# Patient Record
Sex: Female | Born: 1960 | Race: White | Hispanic: No | Marital: Single | Smoking: Former smoker
Health system: Southern US, Community
[De-identification: ages and names within clinical notes are randomized; demographics above are authoritative.]

## PROBLEM LIST (undated history)

## (undated) DIAGNOSIS — G4700 Insomnia, unspecified: Secondary | ICD-10-CM

## (undated) DIAGNOSIS — J449 Chronic obstructive pulmonary disease, unspecified: Secondary | ICD-10-CM

## (undated) DIAGNOSIS — M810 Age-related osteoporosis without current pathological fracture: Secondary | ICD-10-CM

## (undated) HISTORY — DX: Chronic obstructive pulmonary disease, unspecified: J44.9

## (undated) HISTORY — DX: Age-related osteoporosis without current pathological fracture: M81.0

## (undated) HISTORY — DX: Insomnia, unspecified: G47.00

## (undated) HISTORY — PX: BREAST BIOPSY: SHX20

---

## 2009-09-08 ENCOUNTER — Emergency Department: Payer: Self-pay | Admitting: Emergency Medicine

## 2016-02-19 DIAGNOSIS — J209 Acute bronchitis, unspecified: Secondary | ICD-10-CM | POA: Diagnosis not present

## 2016-03-02 DIAGNOSIS — R52 Pain, unspecified: Secondary | ICD-10-CM | POA: Diagnosis not present

## 2016-03-02 DIAGNOSIS — B999 Unspecified infectious disease: Secondary | ICD-10-CM | POA: Diagnosis not present

## 2016-03-02 DIAGNOSIS — R110 Nausea: Secondary | ICD-10-CM | POA: Diagnosis not present

## 2016-03-02 DIAGNOSIS — M2550 Pain in unspecified joint: Secondary | ICD-10-CM | POA: Diagnosis not present

## 2016-03-22 DIAGNOSIS — Z6821 Body mass index (BMI) 21.0-21.9, adult: Secondary | ICD-10-CM | POA: Diagnosis not present

## 2016-03-22 DIAGNOSIS — N939 Abnormal uterine and vaginal bleeding, unspecified: Secondary | ICD-10-CM | POA: Diagnosis not present

## 2016-03-22 DIAGNOSIS — F17200 Nicotine dependence, unspecified, uncomplicated: Secondary | ICD-10-CM | POA: Diagnosis not present

## 2016-03-30 DIAGNOSIS — R05 Cough: Secondary | ICD-10-CM | POA: Diagnosis not present

## 2016-03-30 DIAGNOSIS — J069 Acute upper respiratory infection, unspecified: Secondary | ICD-10-CM | POA: Diagnosis not present

## 2016-04-01 DIAGNOSIS — E860 Dehydration: Secondary | ICD-10-CM | POA: Diagnosis not present

## 2016-04-01 DIAGNOSIS — R109 Unspecified abdominal pain: Secondary | ICD-10-CM | POA: Diagnosis not present

## 2016-04-01 DIAGNOSIS — R197 Diarrhea, unspecified: Secondary | ICD-10-CM | POA: Diagnosis not present

## 2016-04-01 DIAGNOSIS — R1012 Left upper quadrant pain: Secondary | ICD-10-CM | POA: Diagnosis not present

## 2016-04-01 DIAGNOSIS — R112 Nausea with vomiting, unspecified: Secondary | ICD-10-CM | POA: Diagnosis not present

## 2016-04-11 DIAGNOSIS — L639 Alopecia areata, unspecified: Secondary | ICD-10-CM | POA: Diagnosis not present

## 2016-05-04 DIAGNOSIS — M810 Age-related osteoporosis without current pathological fracture: Secondary | ICD-10-CM | POA: Diagnosis not present

## 2016-06-03 DIAGNOSIS — L639 Alopecia areata, unspecified: Secondary | ICD-10-CM | POA: Diagnosis not present

## 2016-07-15 DIAGNOSIS — L639 Alopecia areata, unspecified: Secondary | ICD-10-CM | POA: Diagnosis not present

## 2016-08-11 DIAGNOSIS — J449 Chronic obstructive pulmonary disease, unspecified: Secondary | ICD-10-CM | POA: Diagnosis not present

## 2016-08-16 ENCOUNTER — Ambulatory Visit: Payer: Self-pay | Admitting: Family Medicine

## 2016-08-25 DIAGNOSIS — L639 Alopecia areata, unspecified: Secondary | ICD-10-CM | POA: Diagnosis not present

## 2016-10-07 DIAGNOSIS — L639 Alopecia areata, unspecified: Secondary | ICD-10-CM | POA: Diagnosis not present

## 2016-12-09 DIAGNOSIS — L639 Alopecia areata, unspecified: Secondary | ICD-10-CM | POA: Diagnosis not present

## 2016-12-29 DIAGNOSIS — J0100 Acute maxillary sinusitis, unspecified: Secondary | ICD-10-CM | POA: Diagnosis not present

## 2016-12-29 DIAGNOSIS — J050 Acute obstructive laryngitis [croup]: Secondary | ICD-10-CM | POA: Diagnosis not present

## 2017-01-06 ENCOUNTER — Other Ambulatory Visit: Payer: Self-pay | Admitting: Internal Medicine

## 2017-01-06 DIAGNOSIS — J449 Chronic obstructive pulmonary disease, unspecified: Secondary | ICD-10-CM | POA: Diagnosis not present

## 2017-01-06 DIAGNOSIS — L638 Other alopecia areata: Secondary | ICD-10-CM | POA: Diagnosis not present

## 2017-01-06 DIAGNOSIS — N390 Urinary tract infection, site not specified: Secondary | ICD-10-CM | POA: Diagnosis not present

## 2017-01-06 DIAGNOSIS — Z1231 Encounter for screening mammogram for malignant neoplasm of breast: Secondary | ICD-10-CM

## 2017-01-09 ENCOUNTER — Other Ambulatory Visit: Payer: Self-pay | Admitting: Internal Medicine

## 2017-01-09 DIAGNOSIS — R102 Pelvic and perineal pain: Secondary | ICD-10-CM

## 2017-01-16 ENCOUNTER — Ambulatory Visit: Payer: BLUE CROSS/BLUE SHIELD

## 2017-01-19 ENCOUNTER — Other Ambulatory Visit: Payer: Self-pay | Admitting: *Deleted

## 2017-01-19 ENCOUNTER — Inpatient Hospital Stay
Admission: RE | Admit: 2017-01-19 | Discharge: 2017-01-19 | Disposition: A | Discharge status: Home / Self Care | Payer: Self-pay | Source: Ambulatory Visit | Attending: *Deleted | Admitting: *Deleted

## 2017-01-19 DIAGNOSIS — Z9289 Personal history of other medical treatment: Secondary | ICD-10-CM

## 2017-02-02 DIAGNOSIS — L639 Alopecia areata, unspecified: Secondary | ICD-10-CM | POA: Diagnosis not present

## 2017-02-03 ENCOUNTER — Encounter: Payer: Self-pay | Admitting: Radiology

## 2017-02-03 ENCOUNTER — Ambulatory Visit
Admission: RE | Admit: 2017-02-03 | Discharge: 2017-02-03 | Disposition: A | Discharge status: Home / Self Care | Payer: BLUE CROSS/BLUE SHIELD | Source: Ambulatory Visit | Attending: Internal Medicine | Admitting: Internal Medicine

## 2017-02-03 DIAGNOSIS — Z1231 Encounter for screening mammogram for malignant neoplasm of breast: Secondary | ICD-10-CM | POA: Diagnosis not present

## 2017-02-03 DIAGNOSIS — R928 Other abnormal and inconclusive findings on diagnostic imaging of breast: Secondary | ICD-10-CM | POA: Insufficient documentation

## 2017-02-07 ENCOUNTER — Other Ambulatory Visit: Payer: Self-pay | Admitting: Internal Medicine

## 2017-02-07 DIAGNOSIS — R928 Other abnormal and inconclusive findings on diagnostic imaging of breast: Secondary | ICD-10-CM

## 2017-02-07 DIAGNOSIS — N6489 Other specified disorders of breast: Secondary | ICD-10-CM

## 2017-02-11 DIAGNOSIS — F430 Acute stress reaction: Secondary | ICD-10-CM | POA: Diagnosis not present

## 2017-02-11 DIAGNOSIS — R002 Palpitations: Secondary | ICD-10-CM | POA: Diagnosis not present

## 2017-02-11 DIAGNOSIS — F419 Anxiety disorder, unspecified: Secondary | ICD-10-CM | POA: Diagnosis not present

## 2017-02-14 ENCOUNTER — Ambulatory Visit
Admission: RE | Admit: 2017-02-14 | Discharge: 2017-02-14 | Disposition: A | Discharge status: Home / Self Care | Payer: BLUE CROSS/BLUE SHIELD | Source: Ambulatory Visit | Attending: Internal Medicine | Admitting: Internal Medicine

## 2017-02-14 DIAGNOSIS — N6489 Other specified disorders of breast: Secondary | ICD-10-CM

## 2017-02-14 DIAGNOSIS — R928 Other abnormal and inconclusive findings on diagnostic imaging of breast: Secondary | ICD-10-CM | POA: Diagnosis not present

## 2017-02-14 DIAGNOSIS — N63 Unspecified lump in unspecified breast: Secondary | ICD-10-CM | POA: Diagnosis present

## 2017-02-15 ENCOUNTER — Other Ambulatory Visit: Payer: Self-pay | Admitting: Nurse Practitioner

## 2017-02-15 DIAGNOSIS — N63 Unspecified lump in unspecified breast: Secondary | ICD-10-CM

## 2017-02-24 DIAGNOSIS — Z0000 Encounter for general adult medical examination without abnormal findings: Secondary | ICD-10-CM | POA: Diagnosis not present

## 2017-02-24 DIAGNOSIS — L638 Other alopecia areata: Secondary | ICD-10-CM | POA: Diagnosis not present

## 2017-02-24 DIAGNOSIS — M2550 Pain in unspecified joint: Secondary | ICD-10-CM | POA: Diagnosis not present

## 2017-02-24 DIAGNOSIS — J449 Chronic obstructive pulmonary disease, unspecified: Secondary | ICD-10-CM | POA: Diagnosis not present

## 2017-02-24 DIAGNOSIS — N390 Urinary tract infection, site not specified: Secondary | ICD-10-CM | POA: Diagnosis not present

## 2017-02-24 DIAGNOSIS — Z124 Encounter for screening for malignant neoplasm of cervix: Secondary | ICD-10-CM | POA: Diagnosis not present

## 2017-02-24 DIAGNOSIS — R102 Pelvic and perineal pain: Secondary | ICD-10-CM | POA: Diagnosis not present

## 2017-02-24 DIAGNOSIS — F411 Generalized anxiety disorder: Secondary | ICD-10-CM | POA: Diagnosis not present

## 2017-02-24 DIAGNOSIS — M810 Age-related osteoporosis without current pathological fracture: Secondary | ICD-10-CM | POA: Diagnosis not present

## 2017-04-03 DIAGNOSIS — F411 Generalized anxiety disorder: Secondary | ICD-10-CM | POA: Diagnosis not present

## 2017-04-03 DIAGNOSIS — E782 Mixed hyperlipidemia: Secondary | ICD-10-CM | POA: Diagnosis not present

## 2017-04-03 DIAGNOSIS — K635 Polyp of colon: Secondary | ICD-10-CM | POA: Diagnosis not present

## 2017-04-03 DIAGNOSIS — R102 Pelvic and perineal pain: Secondary | ICD-10-CM | POA: Diagnosis not present

## 2017-04-10 ENCOUNTER — Ambulatory Visit: Payer: BLUE CROSS/BLUE SHIELD

## 2017-04-18 DIAGNOSIS — Z79899 Other long term (current) drug therapy: Secondary | ICD-10-CM | POA: Diagnosis not present

## 2017-04-18 DIAGNOSIS — L639 Alopecia areata, unspecified: Secondary | ICD-10-CM | POA: Diagnosis not present

## 2017-08-04 ENCOUNTER — Other Ambulatory Visit: Payer: BLUE CROSS/BLUE SHIELD

## 2017-10-05 DIAGNOSIS — L639 Alopecia areata, unspecified: Secondary | ICD-10-CM | POA: Diagnosis not present

## 2017-10-05 DIAGNOSIS — L821 Other seborrheic keratosis: Secondary | ICD-10-CM | POA: Diagnosis not present

## 2017-10-24 ENCOUNTER — Encounter: Payer: Self-pay | Admitting: Internal Medicine

## 2017-10-24 ENCOUNTER — Ambulatory Visit (INDEPENDENT_AMBULATORY_CARE_PROVIDER_SITE_OTHER): Payer: BLUE CROSS/BLUE SHIELD | Admitting: Internal Medicine

## 2017-10-24 ENCOUNTER — Telehealth: Payer: Self-pay

## 2017-10-24 VITALS — BP 112/68 | HR 90 | Temp 97.8°F | Ht 63.0 in | Wt 123.4 lb

## 2017-10-24 DIAGNOSIS — F17219 Nicotine dependence, cigarettes, with unspecified nicotine-induced disorders: Secondary | ICD-10-CM

## 2017-10-24 DIAGNOSIS — F17210 Nicotine dependence, cigarettes, uncomplicated: Secondary | ICD-10-CM

## 2017-10-24 DIAGNOSIS — F5101 Primary insomnia: Secondary | ICD-10-CM | POA: Diagnosis not present

## 2017-10-24 DIAGNOSIS — J449 Chronic obstructive pulmonary disease, unspecified: Secondary | ICD-10-CM | POA: Diagnosis not present

## 2017-10-24 DIAGNOSIS — N951 Menopausal and female climacteric states: Secondary | ICD-10-CM

## 2017-10-24 DIAGNOSIS — B001 Herpesviral vesicular dermatitis: Secondary | ICD-10-CM | POA: Insufficient documentation

## 2017-10-24 DIAGNOSIS — M810 Age-related osteoporosis without current pathological fracture: Secondary | ICD-10-CM

## 2017-10-25 NOTE — Telephone Encounter (Signed)
Error

## 2017-10-26 ENCOUNTER — Other Ambulatory Visit: Payer: Self-pay

## 2017-10-26 MED ORDER — VARENICLINE TARTRATE 0.5 MG PO TABS: ORAL_TABLET | ORAL | 0 refills | Status: DC

## 2017-10-26 NOTE — Telephone Encounter (Signed)
I will send in chantix. Why does she want rx vit d? She can take 5000 units capsules OTC.

## 2017-10-26 NOTE — Telephone Encounter (Signed)
Left the pt a message that the chantix prescription was faxed to the pharmacy and that she can use the vitd 3 o-t-c and to get 5,000u and make sure it's the gel capsule.

## 2017-10-30 ENCOUNTER — Telehealth: Payer: Self-pay

## 2017-10-30 NOTE — Telephone Encounter (Signed)
Mailbox full  I was calling the pt to let her know that the chantix was faxed to her pharmacy and that she can get vitd 3 o-t-c 5,000 u and to take 1 per day.

## 2017-11-21 ENCOUNTER — Encounter: Payer: Self-pay | Admitting: Internal Medicine

## 2017-11-21 ENCOUNTER — Ambulatory Visit (INDEPENDENT_AMBULATORY_CARE_PROVIDER_SITE_OTHER): Payer: BLUE CROSS/BLUE SHIELD | Admitting: Internal Medicine

## 2017-11-21 VITALS — BP 112/70 | HR 89 | Temp 97.6°F | Ht 63.5 in | Wt 125.6 lb

## 2017-11-21 DIAGNOSIS — L853 Xerosis cutis: Secondary | ICD-10-CM | POA: Insufficient documentation

## 2017-11-21 DIAGNOSIS — F17210 Nicotine dependence, cigarettes, uncomplicated: Secondary | ICD-10-CM | POA: Diagnosis not present

## 2017-11-21 DIAGNOSIS — N951 Menopausal and female climacteric states: Secondary | ICD-10-CM | POA: Diagnosis not present

## 2017-12-03 ENCOUNTER — Encounter: Payer: Self-pay | Admitting: Internal Medicine

## 2017-12-03 DIAGNOSIS — F5101 Primary insomnia: Secondary | ICD-10-CM | POA: Insufficient documentation

## 2017-12-03 DIAGNOSIS — M810 Age-related osteoporosis without current pathological fracture: Secondary | ICD-10-CM | POA: Insufficient documentation

## 2017-12-03 DIAGNOSIS — J449 Chronic obstructive pulmonary disease, unspecified: Secondary | ICD-10-CM | POA: Insufficient documentation

## 2017-12-03 NOTE — Progress Notes (Signed)
Subjective:     Patient ID: Tracy Crane , female    DOB: 04/09/1960 , 57 y.o.   MRN: [id number]   Chief Complaint  Patient presents with  . hormones f/u    HPI  SHE IS HERE TODAY TO ESTABLISH CARE FOR BHRT THERAPY. SHE DOES HAVE PCP; HOWEVER, CANT RECALL THEIR NAME. SHE REPORTS H/O COPD. SHE IS HOPING BHRT THERAPY WILL HELP WITH HOT FLASHES AND INSOMNIA. SHE DENIES PRIOR HISTORY OF HIGH BLOOD PRESSURE AND HEART DISEASE. SHE HAS BEEN ON ORAL ESTROGENS IN THE PAST.  ) [place] - AGE 31. SHE HAD HER LAST CYCLE FOUR YEARS AGO. HER GYN RECENTLY RETIRED. STATES HER LAST MAMMO WAS IN JAN 2019. [id number].  SHE IS NOT CURRENTLY SEXUALLY ACTIVE.       History reviewed. No pertinent past medical history.   Family History  Problem Relation Age of Onset  . Breast cancer Other   . Hyperlipidemia Mother      Current Outpatient Medications:  .  budesonide-formoterol (SYMBICORT) 80-4.5 MCG/ACT inhaler, Inhale 2 puffs into the lungs 2 (two) times daily., Disp: , Rfl:  .  minoxidil (ROGAINE) 2 % external solution, Apply topically 2 (two) times daily., Disp: , Rfl:  .  valACYclovir (VALTREX) 500 MG tablet, Take 500 mg by mouth daily., Disp: , Rfl:  .  albuterol (PROVENTIL HFA;VENTOLIN HFA) 108 (90 Base) MCG/ACT inhaler, Inhale 1 puff into the lungs as needed for wheezing or shortness of breath., Disp: , Rfl:  .  Melatonin 10 MG TABS, Take 1 tablet by mouth at bedtime., Disp: , Rfl:  .  varenicline (CHANTIX) 0.5 MG tablet, Take one tab daily with evening meal x 7 days, then one tab twice daily (Patient not taking: Reported on 11/21/2017), Disp: 60 tablet, Rfl: 0   Allergies  Allergen Reactions  . Eggs Or Egg-Derived Products Anaphylaxis  . Shrimp [Shellfish Allergy] Anaphylaxis  . Prednisone Palpitations     Review of Systems  Constitutional: Negative.   HENT: Negative.   Eyes: Negative.   Respiratory: Negative.   Cardiovascular: Negative.   Gastrointestinal: Negative.   Endocrine:  Negative.   Musculoskeletal: Negative.   Skin: Negative.   Allergic/Immunologic: Negative.   Neurological: Negative.   Hematological: Negative.   Psychiatric/Behavioral: Positive for sleep disturbance.     Today's Vitals   10/24/17 1517  BP: 112/68  Pulse: 90  Temp: 97.8 F (36.6 C)  TempSrc: Oral  Weight: 123 lb 6.4 oz (56 kg)  Height: 5\' 3"  (1.6 m)   Body mass index is 21.86 kg/m.   Objective:  Physical Exam  Constitutional: She is oriented to person, place, and time. She appears well-developed and well-nourished.  HENT:  Head: Normocephalic and atraumatic.  Eyes: EOM are normal.  Neck: Normal range of motion. Neck supple.  Cardiovascular: Normal rate, regular rhythm and normal heart sounds.  Pulmonary/Chest: Effort normal and breath sounds normal.  Neurological: She is alert and oriented to person, place, and time.  Psychiatric: She has a normal mood and affect.  Nursing note and vitals reviewed.       Assessment And Plan:     1. Female climacteric state  SHE WISHES TO MOVE FORWARD WITH SALIVA TESTING. SHE WAS INSTRUCTED ON HOW TO SUBMIT SALIVA SAMPLES AND THE TIMING OF EACH SAMPLE. SHE WILL RTO IN FOUR WEEKS TO GO OVER HER RESULTS IN FULL DETAIL.   2. Chronic obstructive pulmonary disease, unspecified COPD type (HCC)  CHRONIC, YET STABLE. IMPORTANCE OF SMOKING CESSATION WAS DISCUSSED  WITH THE PATIENT FOR GREATER THAN 3MINUTES.   3. Primary insomnia  CHRONIC. SHE WILL CONTINUE WITH MELATONIN NIGHTLY. I SUSPECT SHE MAY NEED PROGESTERONE SUPPLEMENTATION. WILL MAKE FURTHER RECOMMENDATIONS ONCE HER SALIVA TEST RESULTS ARE AVAILABLE FOR REVIEW.   4. Osteoporosis, post-menopausal  CHRONIC. BHRT THERAPY MAY BE HELPFUL, IF TOPICAL BIEST IS NEEDED. SHE IS ENCOURAGED TO ENGAGE IN WEIGHT-BEARING EXERCISES AT LEAST THREE DAYS WEEKLY. IMPORTANCE OF CALCIUM AND VIT D SUPPLEMENTATION WAS ALSO DISCUSSED WITH THE PATIENT.   5. Cigarette nicotine dependence without  complication  SHE WAS ADVISED TO CONTACT [facility] FOR TOBACCO CESSATION CLASS.  THE CONTACT NUMBER IS [phone]. IMPORTANCE OF TOBACCO CESSATION WAS DISCUSSED WITH THE PATIENT FOR GREATER THAN 3 MINUTES. WE DISCUSSED USE OF CHANTIX; HOWEVER, SHE IS HESITANT BECAUSE A FRIEND OF HERS DID NOT TOLERATE THE MEDICATION. SHE AGREES TO THINK ABOUT STARTING IT IN THE FUTURE.   Gwynneth Alimentobyn N Travian Kerner, MD

## 2017-12-03 NOTE — Progress Notes (Signed)
Subjective:     Patient ID: Tracy Crane , female    DOB: March 11, 1960 , 57 y.o.   MRN: [id number]   Chief Complaint  Patient presents with  . BHRT F/U    HPI  She is here today to go over her saliva test results. She is eager to start her treatment plan.     Past Medical History:  Diagnosis Date  . COPD (chronic obstructive pulmonary disease) (HCC)   . Insomnia   . Osteoporosis      Family History  Problem Relation Age of Onset  . Breast cancer Other   . Hyperlipidemia Mother      Current Outpatient Medications:  .  albuterol (PROVENTIL HFA;VENTOLIN HFA) 108 (90 Base) MCG/ACT inhaler, Inhale 1 puff into the lungs as needed for wheezing or shortness of breath., Disp: , Rfl:  .  budesonide-formoterol (SYMBICORT) 80-4.5 MCG/ACT inhaler, Inhale 2 puffs into the lungs 2 (two) times daily., Disp: , Rfl:  .  Melatonin 10 MG TABS, Take 1 tablet by mouth at bedtime., Disp: , Rfl:  .  minoxidil (ROGAINE) 2 % external solution, Apply topically 2 (two) times daily., Disp: , Rfl:  .  valACYclovir (VALTREX) 500 MG tablet, Take 500 mg by mouth daily., Disp: , Rfl:  .  varenicline (CHANTIX) 0.5 MG tablet, Take one tab daily with evening meal x 7 days, then one tab twice daily (Patient not taking: Reported on 11/21/2017), Disp: 60 tablet, Rfl: 0   Allergies  Allergen Reactions  . Eggs Or Egg-Derived Products Anaphylaxis  . Shrimp [Shellfish Allergy] Anaphylaxis  . Prednisone Palpitations     Review of Systems  Constitutional: Negative.   Respiratory: Negative.   Cardiovascular: Negative.   Gastrointestinal: Negative.   Neurological: Negative.   Psychiatric/Behavioral: Negative.      Today's Vitals   11/21/17 1439  BP: 112/70  Pulse: 89  Temp: 97.6 F (36.4 C)  TempSrc: Oral  SpO2: 96%  Weight: 125 lb 9.6 oz (57 kg)  Height: 5' 3.5" (1.613 m)  PainSc: 0-No pain   Body mass index is 21.9 kg/m.   Objective:  Physical Exam  Constitutional: She is oriented to person,  place, and time. She appears well-developed and well-nourished.  HENT:  Head: Normocephalic and atraumatic.  Eyes: EOM are normal.  Cardiovascular: Normal rate, regular rhythm and normal heart sounds.  Pulmonary/Chest: Effort normal and breath sounds normal.  Neurological: She is alert and oriented to person, place, and time.  Psychiatric: She has a normal mood and affect.  Nursing note and vitals reviewed.       Assessment And Plan:     1. Female climacteric state  I went over her results for greater than 25 minutes. Saliva test results were significant for nl estrone, nl estradiol and elevated estriol levels. She denies vaginal dryness, so I will not initiate any estrogen therapy. Her progesterone levels are wnl, which resulted in a nl PG/E2 ratio. Lastly, she had nl testosterone and low DHEA levels. Lastly, she had suboptimal cortisol curve suggestive of stage 2 adrenal fatigue. I think she would benefit from progesterone therapy. She is in agreement with her treatment plan. She will rto in four weeks for re-evaluation.   2. Cigarette nicotine dependence without complication  She agrees to resume Chantix. She did not have any problems after the first dose, but again her friend talked her out of taking the medication. She is ready to quit, and agrees to take one capsule daily x 2 weeks,  then increase to twice daily. She wants to titrate her dose slowly due to her fear of experiencing side effects.   3. Dry skin dermatitis  She is encouraged to consider using Aquaphor topically. She is also encouraged to increase her intake of healthy fats. If persistent, I will consider topical estrogen therapy which could improve her sx.   Gwynneth Aliment, MD

## 2017-12-14 ENCOUNTER — Encounter: Payer: Self-pay | Admitting: Internal Medicine

## 2017-12-14 ENCOUNTER — Ambulatory Visit (INDEPENDENT_AMBULATORY_CARE_PROVIDER_SITE_OTHER): Payer: BLUE CROSS/BLUE SHIELD | Admitting: Internal Medicine

## 2017-12-14 VITALS — BP 120/78 | HR 85 | Temp 98.0°F | Ht 63.5 in | Wt 126.0 lb

## 2017-12-14 DIAGNOSIS — F17210 Nicotine dependence, cigarettes, uncomplicated: Secondary | ICD-10-CM

## 2017-12-14 DIAGNOSIS — Z1211 Encounter for screening for malignant neoplasm of colon: Secondary | ICD-10-CM

## 2017-12-14 DIAGNOSIS — N951 Menopausal and female climacteric states: Secondary | ICD-10-CM | POA: Diagnosis not present

## 2017-12-14 DIAGNOSIS — J449 Chronic obstructive pulmonary disease, unspecified: Secondary | ICD-10-CM | POA: Diagnosis not present

## 2017-12-14 MED ORDER — BUDESONIDE-FORMOTEROL FUMARATE 80-4.5 MCG/ACT IN AERO: 2.0000 | INHALATION_SPRAY | Freq: Two times a day (BID) | RESPIRATORY_TRACT | 5 refills | Status: DC

## 2017-12-14 MED ORDER — LORAZEPAM 0.5 MG PO TABS: 0.5000 mg | ORAL_TABLET | Freq: Every evening | ORAL | 0 refills | Status: DC | PRN

## 2017-12-14 MED ORDER — ALBUTEROL SULFATE HFA 108 (90 BASE) MCG/ACT IN AERS: 1.0000 | INHALATION_SPRAY | RESPIRATORY_TRACT | 11 refills | Status: AC | PRN

## 2017-12-14 NOTE — Patient Instructions (Signed)

## 2018-01-01 ENCOUNTER — Encounter: Payer: Self-pay | Admitting: Internal Medicine

## 2018-01-03 ENCOUNTER — Encounter: Payer: Self-pay | Admitting: Internal Medicine

## 2018-01-03 MED ORDER — PROGESTERONE MICRONIZED POWD: 1 refills | Status: AC

## 2018-01-03 NOTE — Progress Notes (Signed)
Subjective:     Patient ID: Tracy Crane , female    DOB: [birth year] , 58 y.o.   MRN: [id number]   Chief Complaint  Patient presents with  . Chantix f/u  . Hormones f/u    HPI  She is here today for f/u Chantix. Unfortunately, she did not tolerate the medication. She reports that it caused her to feel severely depressed within one week of starting the medication.   She is also here today for f/u bhrt. She has yet to start any BHRT regimen.     Past Medical History:  Diagnosis Date  . COPD (chronic obstructive pulmonary disease) (HCC)   . Insomnia   . Osteoporosis      Family History  Problem Relation Age of Onset  . Breast cancer Other   . Hyperlipidemia Mother      Current Outpatient Medications:  .  albuterol (PROVENTIL HFA;VENTOLIN HFA) 108 (90 Base) MCG/ACT inhaler, Inhale 1 puff into the lungs as needed for wheezing or shortness of breath., Disp: 1 Inhaler, Rfl: 11 .  budesonide-formoterol (SYMBICORT) 80-4.5 MCG/ACT inhaler, Inhale 2 puffs into the lungs 2 (two) times daily., Disp: 1 Inhaler, Rfl: 5 .  Melatonin 10 MG TABS, Take 1 tablet by mouth at bedtime., Disp: , Rfl:  .  valACYclovir (VALTREX) 500 MG tablet, Take 500 mg by mouth daily., Disp: , Rfl:  .  LORazepam (ATIVAN) 0.5 MG tablet, Take 1 tablet (0.5 mg total) by mouth at bedtime as needed for anxiety., Disp: 30 tablet, Rfl: 0 .  minoxidil (ROGAINE) 2 % external solution, Apply topically 2 (two) times daily., Disp: , Rfl:  .  Progesterone Micronized (PROGESTERONE, BULK,) POWD, pls dispense progesterone SR capsules 50mg  nightly except sundays, Disp: 30 each, Rfl: 1   Allergies  Allergen Reactions  . Eggs Or Egg-Derived Products Anaphylaxis  . Shrimp [Shellfish Allergy] Anaphylaxis  . Prednisone Palpitations     Review of Systems  Constitutional: Negative.   Respiratory: Negative.   Cardiovascular: Negative.   Gastrointestinal: Negative.   Neurological: Negative.   Psychiatric/Behavioral: Negative.       Today's Vitals   12/14/17 1112  BP: 120/78  Pulse: 85  Temp: 98 F (36.7 C)  TempSrc: Oral  Weight: 126 lb (57.2 kg)  Height: 5' 3.5" (1.613 m)   Body mass index is 21.97 kg/m.   Objective:  Physical Exam Vitals signs and nursing note reviewed.  Constitutional:      Appearance: Normal appearance.  HENT:     Head: Normocephalic and atraumatic.  Cardiovascular:     Rate and Rhythm: Normal rate and regular rhythm.     Heart sounds: Normal heart sounds.  Pulmonary:     Effort: Pulmonary effort is normal.     Breath sounds: Normal breath sounds.  Skin:    General: Skin is warm.  Neurological:     General: No focal deficit present.     Mental Status: She is alert.  Psychiatric:        Mood and Affect: Mood normal.         Assessment And Plan:     1. Cigarette nicotine dependence without complication  She unfortunately, did not tolerate Chantix.    2. Colon cancer screening  I will refer her to GI for CRC screening. She reports h/o polyps.   - Ambulatory referral to Gastroenterology  3. Female climacteric state  I will start her on progesterone SR capsules, 50mg  nightly except Sundays. She will rto in four weeks  for re-evaluation.   4. Chronic obstructive pulmonary disease, unspecified COPD type (HCC)  Chronic. She will continue with current inhalers. She has not had any recent exacerbations and/or hospitalizations.   Gwynneth Alimentobyn N Jakyra Kenealy, MD

## 2018-01-18 DIAGNOSIS — Z1211 Encounter for screening for malignant neoplasm of colon: Secondary | ICD-10-CM | POA: Diagnosis not present

## 2018-01-18 DIAGNOSIS — R194 Change in bowel habit: Secondary | ICD-10-CM | POA: Diagnosis not present

## 2018-01-18 DIAGNOSIS — R110 Nausea: Secondary | ICD-10-CM | POA: Diagnosis not present

## 2018-01-18 DIAGNOSIS — R197 Diarrhea, unspecified: Secondary | ICD-10-CM | POA: Diagnosis not present

## 2018-01-18 DIAGNOSIS — R140 Abdominal distension (gaseous): Secondary | ICD-10-CM | POA: Diagnosis not present

## 2018-01-24 ENCOUNTER — Ambulatory Visit: Payer: BLUE CROSS/BLUE SHIELD | Admitting: Internal Medicine

## 2018-02-01 ENCOUNTER — Telehealth: Payer: Self-pay

## 2018-02-01 DIAGNOSIS — Z86010 Personal history of colonic polyps: Secondary | ICD-10-CM | POA: Diagnosis not present

## 2018-02-01 DIAGNOSIS — Z1211 Encounter for screening for malignant neoplasm of colon: Secondary | ICD-10-CM | POA: Diagnosis not present

## 2018-02-01 NOTE — Telephone Encounter (Signed)
The pt was told that the [facility] clinic didn't have a record of her having a colonoscopy and the pt said that she didn't have a colonoscopy there. The pt was asked if she could come by the office do fill out a records release form and the pt said she will when she leaves Dr. Kenna Gilbert office.

## 2018-05-03 ENCOUNTER — Telehealth: Payer: Self-pay

## 2018-05-03 NOTE — Telephone Encounter (Signed)
I returned the pt's call and left a message that the pt needed a f/u appt and would she like to do a virtual with the NP because Dr. Allyne Gee is out of the office today.  The pt called for a refill of Lorazepam.

## 2018-05-09 ENCOUNTER — Telehealth: Payer: Self-pay

## 2018-05-09 NOTE — Telephone Encounter (Signed)
The pt was told that she needed an appt for evaluation for the Lorazepam refill.  The pt agreed to have a virtual appt and the appt was scheduled.

## 2018-05-10 ENCOUNTER — Ambulatory Visit (INDEPENDENT_AMBULATORY_CARE_PROVIDER_SITE_OTHER): Payer: BLUE CROSS/BLUE SHIELD | Admitting: Internal Medicine

## 2018-05-10 ENCOUNTER — Encounter: Payer: Self-pay | Admitting: Internal Medicine

## 2018-05-10 ENCOUNTER — Other Ambulatory Visit: Payer: Self-pay

## 2018-05-10 ENCOUNTER — Ambulatory Visit: Payer: Self-pay | Admitting: Internal Medicine

## 2018-05-10 VITALS — Ht 63.5 in

## 2018-05-10 DIAGNOSIS — R5383 Other fatigue: Secondary | ICD-10-CM

## 2018-05-10 DIAGNOSIS — R21 Rash and other nonspecific skin eruption: Secondary | ICD-10-CM

## 2018-05-10 DIAGNOSIS — R748 Abnormal levels of other serum enzymes: Secondary | ICD-10-CM | POA: Diagnosis not present

## 2018-05-10 DIAGNOSIS — F5101 Primary insomnia: Secondary | ICD-10-CM

## 2018-05-10 MED ORDER — LORAZEPAM 0.5 MG PO TABS: 0.5000 mg | ORAL_TABLET | Freq: Every evening | ORAL | 0 refills | Status: AC | PRN

## 2018-05-10 NOTE — Patient Instructions (Signed)

## 2018-05-13 NOTE — Progress Notes (Signed)
Virtual Visit via Video   This visit type was conducted due to national recommendations for restrictions regarding the COVID-19 Pandemic (e.g. social distancing) in an effort to limit this patient's exposure and mitigate transmission in our community.  Due to her co-morbid illnesses, this patient is at least at moderate risk for complications without adequate follow up.  This format is felt to be most appropriate for this patient at this time.  All issues noted in this document were discussed and addressed.  A limited physical exam was performed with this format.    This visit type was conducted due to national recommendations for restrictions regarding the COVID-19 Pandemic (e.g. social distancing) in an effort to limit this patient's exposure and mitigate transmission in our community.  Patients identity confirmed using two different identifiers.  This format is felt to be most appropriate for this patient at this time.  All issues noted in this document were discussed and addressed.  No physical exam was performed (except for noted visual exam findings with Video Visits).    Date:  05/13/2018   ID:  Tracy Crane, DOB May 11, 1960, MRN [id number]  Patient Location:  Home  Provider location:   Office    Chief Complaint:  Refill of Lorazepam for sleep  History of Present Illness:    Tracy Crane is a 58 y.o. female who presents via video conferencing for a telehealth visit today.     The patient does not have symptoms concerning for COVID-19 infection (fever, chills, cough, or new shortness of breath).   She presents today for virtual visit. She prefers this method of contact due to COVID-19 pandemic.  She reports that her work load has increased tremendously as a result of the pandemic. She is employed by [employer] - she has had to construct new protocols due to the pandemic. She feels fatigued and stressed. She reports she is not sleeping well. Would like refill of  lorazepam, initially prescribed by previous physician.     Past Medical History:  Diagnosis Date   COPD (chronic obstructive pulmonary disease) (HCC)    Insomnia    Osteoporosis    Past Surgical History:  Procedure Laterality Date   BREAST BIOPSY Right 2007ish   Benign      Current Meds  Medication Sig   albuterol (PROVENTIL HFA;VENTOLIN HFA) 108 (90 Base) MCG/ACT inhaler Inhale 1 puff into the lungs as needed for wheezing or shortness of breath.   budesonide-formoterol (SYMBICORT) 80-4.5 MCG/ACT inhaler Inhale 2 puffs into the lungs 2 (two) times daily.   Iron-Vitamins (GERITOL COMPLETE PO) Take by mouth.   LORazepam (ATIVAN) 0.5 MG tablet Take 1 tablet (0.5 mg total) by mouth at bedtime as needed for anxiety.   minoxidil (ROGAINE) 2 % external solution Apply topically 2 (two) times daily.   Progesterone Micronized (PROGESTERONE, BULK,) POWD pls dispense progesterone SR capsules '50mg'$  nightly except sundays   valACYclovir (VALTREX) 500 MG tablet Take 500 mg by mouth daily.   [DISCONTINUED] LORazepam (ATIVAN) 0.5 MG tablet Take 1 tablet (0.5 mg total) by mouth at bedtime as needed for anxiety.     Allergies:   Eggs or egg-derived products; Shrimp [shellfish allergy]; and Prednisone   Social History   Tobacco Use   Smoking status: Current Every Day Smoker    Packs/day: 20.00    Types: Cigarettes   Smokeless tobacco: Former Systems developer  Substance Use Topics   Alcohol use: Not Currently   Drug use: Not Currently    Types:  Cocaine     Family Hx: The patient's family history includes Breast cancer in an other family member; Emphysema in her father; Hyperlipidemia in her mother; Hypertension in her father.  ROS:   Please see the history of present illness.    Review of Systems  Constitutional: Positive for malaise/fatigue.  Respiratory: Negative.   Cardiovascular: Negative.   Gastrointestinal: Negative.   Neurological: Negative.   Psychiatric/Behavioral: The  patient has insomnia.     All other systems reviewed and are negative.   Labs/Other Tests and Data Reviewed:    Recent Labs: No results found for requested labs within last 8760 hours.   Recent Lipid Panel No results found for: CHOL, TRIG, HDL, CHOLHDL, LDLCALC, LDLDIRECT  Wt Readings from Last 3 Encounters:  12/14/17 126 lb (57.2 kg)  11/21/17 125 lb 9.6 oz (57 kg)  10/24/17 123 lb 6.4 oz (56 kg)     Exam:    Vital Signs:  Ht 5' 3.5" (1.613 m)    BMI 21.97 kg/m     Physical Exam  Constitutional: She is oriented to person, place, and time and well-developed, well-nourished, and in no distress.  HENT:  Head: Normocephalic and atraumatic.  Neck: Normal range of motion.  Pulmonary/Chest: Effort normal.  Neurological: She is alert and oriented to person, place, and time.  Psychiatric: Affect normal.  Nursing note and vitals reviewed.   ASSESSMENT & PLAN:     1. Primary insomnia  Chronic. lmportance of adopting good bedtime hygiene and a nightly routine were discussed with the patient. She was also given refill of lorazepam to use sparingly. She is also encouraged to start progesterone nightly as per BHRT discussion we had at her last visit. She agrees to f/u in six weeks.   2. Abnormal liver enzymes  She reports evaluation with GI, Dr. Collene Mares revealed elevated liver enzymes. She agrees to come in next week for repeat bloodwork. She is encouraged to limit her intake of sugary beverages and processed foods. I will request and review her records from Dr. Collene Mares since I cannot find them in her chart.  - CMP14+EGFR; Future  3. Rash  I am unable to see this via video. She feels this is related to COVID-19 despite lack of fever, chills, SOB and cough. She has not lost sense of taste/smell. She is advised to contact 1-800-[facility]NOVANT for information regarding testing sites.   4. Fatigue, unspecified type  Possibly related to anxiety surrounding the COVID-19 pandemic and the extra  work load that has resulted from this. I will check CBC at her next visit. She is encouraged to stay well hydrated and to exercise regularly. I will make further recommendations once her labs are available for review.   - CBC with Diff; Future    COVID-19 Education: The signs and symptoms of COVID-19 were discussed with the patient and how to seek care for testing (follow up with PCP or arrange E-visit).  The importance of social distancing was discussed today.  Patient Risk:   After full review of this patients clinical status, I feel that they are at least moderate risk at this time.  Time:   Today, I have spent 19 minutes/ 30 seconds with the patient with telehealth technology discussing above diagnoses.     Medication Adjustments/Labs and Tests Ordered: Current medicines are reviewed at length with the patient today.  Concerns regarding medicines are outlined above.   Tests Ordered: Orders Placed This Encounter  Procedures   CMP14+EGFR   CBC  with Diff    Medication Changes: Meds ordered this encounter  Medications   LORazepam (ATIVAN) 0.5 MG tablet    Sig: Take 1 tablet (0.5 mg total) by mouth at bedtime as needed for anxiety.    Dispense:  30 tablet    Refill:  0    Disposition:  Follow up in 6 week(s)  Signed, Maximino Greenland, MD

## 2018-05-14 ENCOUNTER — Other Ambulatory Visit: Payer: Self-pay | Admitting: Internal Medicine

## 2018-05-14 ENCOUNTER — Other Ambulatory Visit: Payer: Self-pay

## 2018-05-14 ENCOUNTER — Other Ambulatory Visit: Payer: BLUE CROSS/BLUE SHIELD

## 2018-05-14 DIAGNOSIS — R5383 Other fatigue: Secondary | ICD-10-CM | POA: Diagnosis not present

## 2018-05-14 DIAGNOSIS — R748 Abnormal levels of other serum enzymes: Secondary | ICD-10-CM | POA: Diagnosis not present

## 2018-05-15 LAB — CBC WITH DIFFERENTIAL/PLATELET
Basophils Absolute: 0.1 10*3/uL (ref 0.0–0.2)
Basos: 1 %
EOS (ABSOLUTE): 0.2 10*3/uL (ref 0.0–0.4)
Eos: 2 %
Hematocrit: 42.7 % (ref 34.0–46.6)
Hemoglobin: 14.7 g/dL (ref 11.1–15.9)
Immature Grans (Abs): 0 10*3/uL (ref 0.0–0.1)
Immature Granulocytes: 0 %
Lymphocytes Absolute: 2.6 10*3/uL (ref 0.7–3.1)
Lymphs: 35 %
MCH: 31.2 pg (ref 26.6–33.0)
MCHC: 34.4 g/dL (ref 31.5–35.7)
MCV: 91 fL (ref 79–97)
Monocytes Absolute: 0.4 10*3/uL (ref 0.1–0.9)
Monocytes: 6 %
Neutrophils Absolute: 4.2 10*3/uL (ref 1.4–7.0)
Neutrophils: 56 %
Platelets: 236 10*3/uL (ref 150–450)
RBC: 4.71 x10E6/uL (ref 3.77–5.28)
RDW: 13.1 % (ref 11.7–15.4)
WBC: 7.5 10*3/uL (ref 3.4–10.8)

## 2018-05-15 LAB — CMP14+EGFR
ALT: 18 IU/L (ref 0–32)
AST: 12 IU/L (ref 0–40)
Albumin/Globulin Ratio: 2 (ref 1.2–2.2)
Albumin: 4.4 g/dL (ref 3.8–4.9)
Alkaline Phosphatase: 173 IU/L — ABNORMAL HIGH (ref 39–117)
BUN/Creatinine Ratio: 13 (ref 9–23)
BUN: 12 mg/dL (ref 6–24)
Bilirubin Total: 0.4 mg/dL (ref 0.0–1.2)
CO2: 23 mmol/L (ref 20–29)
Calcium: 9.3 mg/dL (ref 8.7–10.2)
Chloride: 105 mmol/L (ref 96–106)
Creatinine, Ser: 0.91 mg/dL (ref 0.57–1.00)
GFR calc Af Amer: 81 mL/min/{1.73_m2} (ref >59)
GFR calc non Af Amer: 70 mL/min/{1.73_m2} (ref >59)
Globulin, Total: 2.2 g/dL (ref 1.5–4.5)
Glucose: 101 mg/dL — ABNORMAL HIGH (ref 65–99)
Potassium: 3.7 mmol/L (ref 3.5–5.2)
Sodium: 140 mmol/L (ref 134–144)
Total Protein: 6.6 g/dL (ref 6.0–8.5)

## 2018-06-13 DIAGNOSIS — K635 Polyp of colon: Secondary | ICD-10-CM | POA: Diagnosis not present

## 2018-06-13 DIAGNOSIS — Z1211 Encounter for screening for malignant neoplasm of colon: Secondary | ICD-10-CM | POA: Diagnosis not present

## 2018-06-13 DIAGNOSIS — D125 Benign neoplasm of sigmoid colon: Secondary | ICD-10-CM | POA: Diagnosis not present

## 2018-06-13 DIAGNOSIS — K6389 Other specified diseases of intestine: Secondary | ICD-10-CM | POA: Diagnosis not present

## 2018-06-13 LAB — HM COLONOSCOPY

## 2018-06-18 ENCOUNTER — Ambulatory Visit: Payer: BLUE CROSS/BLUE SHIELD | Admitting: Internal Medicine

## 2018-06-19 ENCOUNTER — Other Ambulatory Visit: Payer: Self-pay

## 2018-06-19 ENCOUNTER — Encounter: Payer: Self-pay | Admitting: Internal Medicine

## 2018-06-19 ENCOUNTER — Ambulatory Visit (INDEPENDENT_AMBULATORY_CARE_PROVIDER_SITE_OTHER): Payer: BC Managed Care – PPO | Admitting: Internal Medicine

## 2018-06-19 VITALS — Ht 63.5 in

## 2018-06-19 DIAGNOSIS — N951 Menopausal and female climacteric states: Secondary | ICD-10-CM | POA: Diagnosis not present

## 2018-06-19 DIAGNOSIS — M79605 Pain in left leg: Secondary | ICD-10-CM

## 2018-06-19 DIAGNOSIS — R0789 Other chest pain: Secondary | ICD-10-CM | POA: Diagnosis not present

## 2018-06-19 DIAGNOSIS — R748 Abnormal levels of other serum enzymes: Secondary | ICD-10-CM | POA: Diagnosis not present

## 2018-06-19 DIAGNOSIS — Z1239 Encounter for other screening for malignant neoplasm of breast: Secondary | ICD-10-CM | POA: Diagnosis not present

## 2018-06-22 ENCOUNTER — Encounter: Payer: Self-pay | Admitting: Internal Medicine

## 2018-06-24 NOTE — Progress Notes (Signed)
Virtual Visit via Video   This visit type was conducted due to national recommendations for restrictions regarding the COVID-19 Pandemic (e.g. social distancing) in an effort to limit this patient's exposure and mitigate transmission in our community.  Due to her co-morbid illnesses, this patient is at least at moderate risk for complications without adequate follow up.  This format is felt to be most appropriate for this patient at this time.  All issues noted in this document were discussed and addressed.  A limited physical exam was performed with this format.    This visit type was conducted due to national recommendations for restrictions regarding the COVID-19 Pandemic (e.g. social distancing) in an effort to limit this patient's exposure and mitigate transmission in our community.  Patients identity confirmed using two different identifiers.  This format is felt to be most appropriate for this patient at this time.  All issues noted in this document were discussed and addressed.  No physical exam was performed (except for noted visual exam findings with Video Visits).    Date:  06/24/2018   ID:  Tracy Crane, DOB August 06, 1960, MRN [id number]  Patient Location:  Home  Provider location:   Office    Chief Complaint:  BHRT f/u  History of Present Illness:    Tracy Crane is a 58 y.o. female who presents via video conferencing for a telehealth visit today.    The patient does not have symptoms concerning for COVID-19 infection (fever, chills, cough, or new shortness of breath).   She presents today for virtual visit. She prefers this method of contact due to COVID-19 pandemic.  She presents today for BHRT f/u. She was started on nightly progesterone capsules at her last visit. She has noticed her sleep quality has improved. She has not noticed any bad side effects.     Past Medical History:  Diagnosis Date  . COPD (chronic obstructive pulmonary disease) (HCC)   . Insomnia   .  Osteoporosis    Past Surgical History:  Procedure Laterality Date  . BREAST BIOPSY Right 2007ish   Benign      Current Meds  Medication Sig  . albuterol (PROVENTIL HFA;VENTOLIN HFA) 108 (90 Base) MCG/ACT inhaler Inhale 1 puff into the lungs as needed for wheezing or shortness of breath.  . budesonide-formoterol (SYMBICORT) 80-4.5 MCG/ACT inhaler Inhale 2 puffs into the lungs 2 (two) times daily.  . Iron-Vitamins (GERITOL COMPLETE PO) Take by mouth.  Marland Kitchen. LORazepam (ATIVAN) 0.5 MG tablet Take 1 tablet (0.5 mg total) by mouth at bedtime as needed for anxiety.  . Melatonin 10 MG TABS Take 1 tablet by mouth at bedtime.  . minoxidil (ROGAINE) 2 % external solution Apply topically 2 (two) times daily.  . NON FORMULARY CBD oil  . valACYclovir (VALTREX) 500 MG tablet Take 500 mg by mouth daily.  . [DISCONTINUED] Progesterone Micronized (PROGESTERONE PO) Take by mouth.     Allergies:   Eggs or egg-derived products, Shrimp [shellfish allergy], and Prednisone   Social History   Tobacco Use  . Smoking status: Current Every Day Smoker    Packs/day: 20.00    Types: Cigarettes  . Smokeless tobacco: Former Engineer, waterUser  Substance Use Topics  . Alcohol use: Not Currently  . Drug use: Not Currently    Types: Cocaine     Family Hx: The patient's family history includes Breast cancer in an other family member; Emphysema in her father; Hyperlipidemia in her mother; Hypertension in her father.  ROS:   Please  see the history of present illness.    Review of Systems  Constitutional: Negative.   Respiratory: Negative.   Cardiovascular: Positive for chest pain.       She c/o intermittent left-sided cp. Unable to determine what triggers sx - usually occurs at rest. No associated palpitations, shortness of breath.   Gastrointestinal: Negative.   Musculoskeletal:       She c/o left leg pain. She denies fall/trauma. She has had skin biopsy in same area. She is not sure if her discomfort is related to previous  procedure. She is unable to state what triggers her pain, but sx appear to be worsening.   Neurological: Negative.   Psychiatric/Behavioral: Negative.     All other systems reviewed and are negative.   Labs/Other Tests and Data Reviewed:    Recent Labs: 05/14/2018: ALT 18; BUN 12; Creatinine, Ser 0.91; Hemoglobin 14.7; Platelets 236; Potassium 3.7; Sodium 140   Recent Lipid Panel No results found for: CHOL, TRIG, HDL, CHOLHDL, LDLCALC, LDLDIRECT  Wt Readings from Last 3 Encounters:  12/14/17 126 lb (57.2 kg)  11/21/17 125 lb 9.6 oz (57 kg)  10/24/17 123 lb 6.4 oz (56 kg)     Exam:    Vital Signs:  Ht 5' 3.5" (1.613 m)   BMI 21.97 kg/m     Physical Exam  Pulmonary/Chest: Effort normal.  Neurological: She is alert.  Psychiatric: Judgment normal.    ASSESSMENT & PLAN:     1. Female climacteric state  She reports feeling well with supplemental progesterone. Rx refill will be called into [facility]. She will rto in 4 months for re-evaluation.   2. Left leg pain  I am not sure what is causing her symptoms. She reports having skin biopsy in same area. She will f/u with Dermatology for further evaluation.   3. Atypical chest pain  I do not think her sx are cardiac-related, but is a possibility due to her smoking history. I will first check CXR. I will make further recommendations once her results are available for review.   - DG Chest 2 View; Future  4. Breast cancer screening  I will refer [facility] to [facility] for [facility] mamm[facility] as requested.  - MM Digital Screening; Future    COVID-19 Education: The signs and symptoms of COVID-19 were discussed with the patient and how to seek care for testing (follow up with PCP or arrange E-visit).  The importance of social distancing was discussed today.  Patient Risk:   After full review of this patients clinical status, I feel that they are at least moderate risk at this time.  Time:   Today, I have spent  17 minutes/ 42 seconds with the patient with telehealth technology discussing above diagnoses.  This is a failed virtual visit, video visit failed after 1 minute. Therefore, remainder of the visit was conducted by phone.    Medication Adjustments/Labs and Tests Ordered: Current medicines are reviewed at length with the patient today.  Concerns regarding medicines are outlined above.   Tests Ordered: Orders Placed This Encounter  Procedures  . DG Chest 2 View  . MM Digital Screening    Medication Changes: No orders of the defined types were placed in this encounter.   Disposition:  Follow up in 4 month(s)  Signed, Maximino Greenland, MD

## 2018-06-26 DIAGNOSIS — R748 Abnormal levels of other serum enzymes: Secondary | ICD-10-CM | POA: Diagnosis not present

## 2018-06-28 ENCOUNTER — Other Ambulatory Visit: Payer: Self-pay | Admitting: Internal Medicine

## 2018-06-28 DIAGNOSIS — R928 Other abnormal and inconclusive findings on diagnostic imaging of breast: Secondary | ICD-10-CM

## 2018-07-02 ENCOUNTER — Other Ambulatory Visit: Payer: Self-pay | Admitting: Internal Medicine

## 2018-07-02 DIAGNOSIS — R928 Other abnormal and inconclusive findings on diagnostic imaging of breast: Secondary | ICD-10-CM

## 2018-07-09 ENCOUNTER — Ambulatory Visit
Admission: RE | Admit: 2018-07-09 | Discharge: 2018-07-09 | Disposition: A | Discharge status: Home / Self Care | Payer: BC Managed Care – PPO | Source: Ambulatory Visit | Attending: Internal Medicine | Admitting: Internal Medicine

## 2018-07-09 DIAGNOSIS — R928 Other abnormal and inconclusive findings on diagnostic imaging of breast: Secondary | ICD-10-CM | POA: Diagnosis not present

## 2018-07-09 DIAGNOSIS — R0789 Other chest pain: Secondary | ICD-10-CM | POA: Diagnosis not present

## 2018-07-09 DIAGNOSIS — R079 Chest pain, unspecified: Secondary | ICD-10-CM | POA: Diagnosis not present

## 2018-07-09 DIAGNOSIS — N644 Mastodynia: Secondary | ICD-10-CM | POA: Diagnosis not present

## 2018-07-16 ENCOUNTER — Other Ambulatory Visit: Payer: Self-pay

## 2018-07-16 MED ORDER — BUDESONIDE-FORMOTEROL FUMARATE 80-4.5 MCG/ACT IN AERO: 2.0000 | INHALATION_SPRAY | Freq: Two times a day (BID) | RESPIRATORY_TRACT | 5 refills | Status: AC

## 2018-10-23 ENCOUNTER — Ambulatory Visit: Payer: BC Managed Care – PPO | Admitting: Internal Medicine

## 2018-11-20 ENCOUNTER — Other Ambulatory Visit: Payer: Self-pay | Admitting: Internal Medicine

## 2019-01-17 ENCOUNTER — Encounter: Payer: Self-pay | Admitting: Internal Medicine

## 2019-01-21 DIAGNOSIS — F411 Generalized anxiety disorder: Secondary | ICD-10-CM | POA: Diagnosis not present

## 2019-01-21 DIAGNOSIS — Z0000 Encounter for general adult medical examination without abnormal findings: Secondary | ICD-10-CM | POA: Diagnosis not present

## 2019-01-21 DIAGNOSIS — M810 Age-related osteoporosis without current pathological fracture: Secondary | ICD-10-CM | POA: Diagnosis not present

## 2019-01-21 DIAGNOSIS — Z131 Encounter for screening for diabetes mellitus: Secondary | ICD-10-CM | POA: Diagnosis not present

## 2019-01-21 DIAGNOSIS — J439 Emphysema, unspecified: Secondary | ICD-10-CM | POA: Diagnosis not present

## 2019-01-21 DIAGNOSIS — I10 Essential (primary) hypertension: Secondary | ICD-10-CM | POA: Diagnosis not present

## 2019-01-21 DIAGNOSIS — E7800 Pure hypercholesterolemia, unspecified: Secondary | ICD-10-CM | POA: Diagnosis not present

## 2019-03-28 ENCOUNTER — Ambulatory Visit: Payer: Self-pay | Attending: Internal Medicine

## 2019-03-28 DIAGNOSIS — Z23 Encounter for immunization: Secondary | ICD-10-CM

## 2019-03-28 NOTE — Progress Notes (Signed)
   Covid-19 Vaccination Clinic  Name:  [name]    MRN: [id number] DOB: [birth year]  03/28/2019  [name] was observed post Covid-19 immunization for 30 minutes based on pre-vaccination screening without incident. She was provided with Vaccine Information Sheet and instruction to access the V-Safe system.   [name] was instructed to call 911 with any severe reactions post vaccine: Marland Kitchen Difficulty breathing  . Swelling of face and throat  . A fast heartbeat  . A bad rash all over body  . Dizziness and weakness   Immunizations Administered    Name Date Dose VIS Date Route   Pfizer COVID-19 Vaccine 03/28/2019 10:48 AM 0.3 mL 12/14/2018 Intramuscular   Manufacturer: [employer], [employer]   Lot: [id number]   NDC: [address]-7

## 2019-04-04 IMAGING — MG MM DIGITAL DIAGNOSTIC UNILAT*L* W/ TOMO W/ CAD
8 of 17 series · 8 of 40 positions shown · non-contrast
Comparison: Previous exam(s).

CLINICAL DATA: Patient recalled from screening for left breast
asymmetry.

EXAM:
2D DIGITAL DIAGNOSTIC LEFT MAMMOGRAM WITH CAD AND ADJUNCT TOMO
ULTRASOUND LEFT BREAST

[L ML]
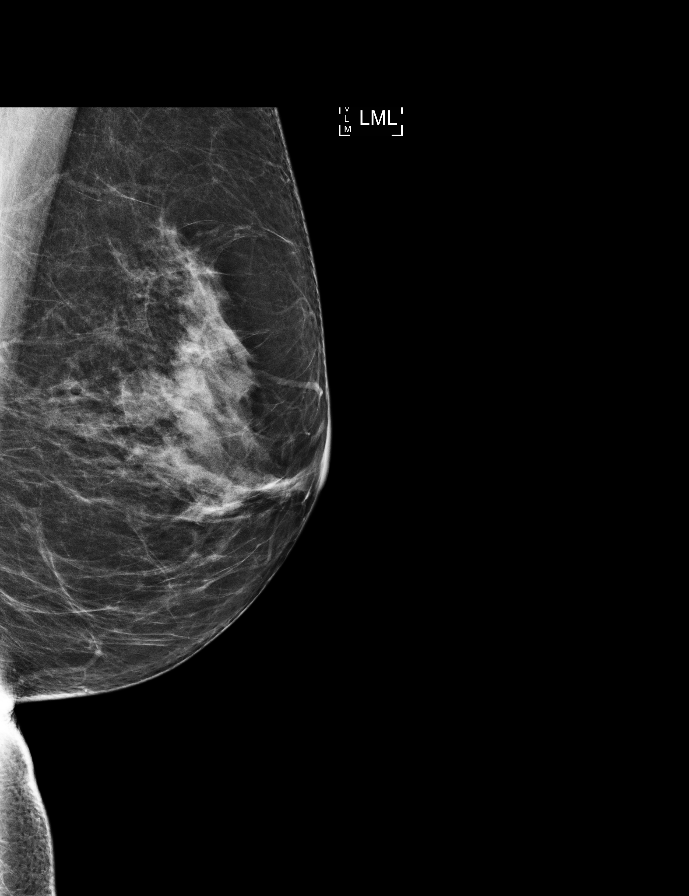

[L CC (1 of 2)]
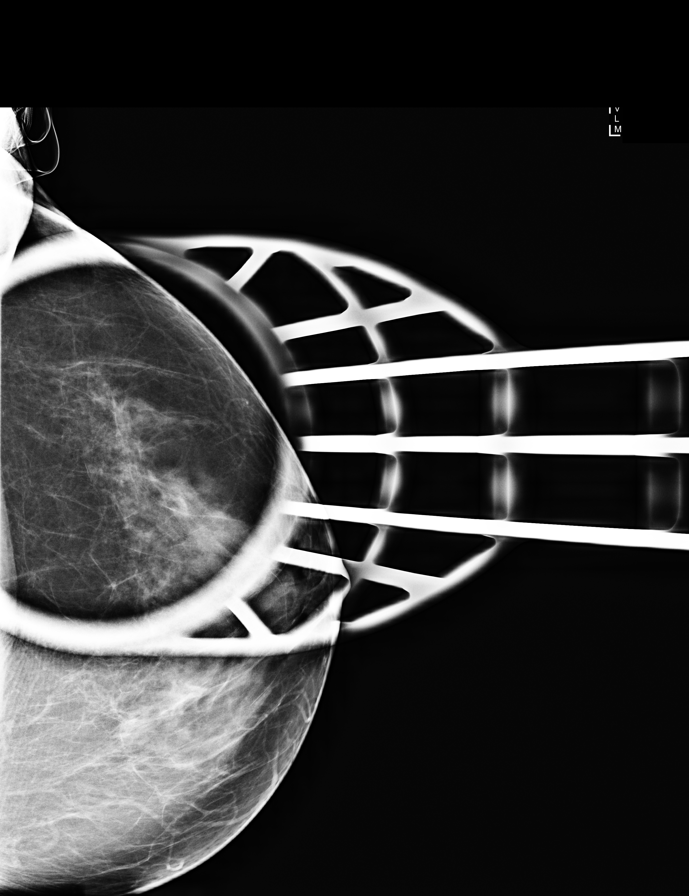

[L ML synth-2D]
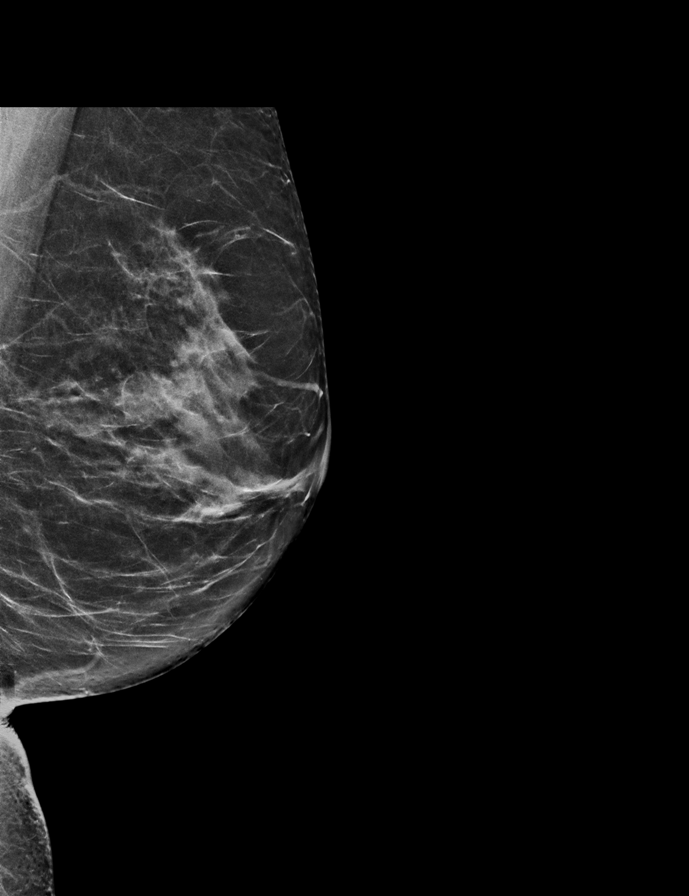

[L CC synth-2D]
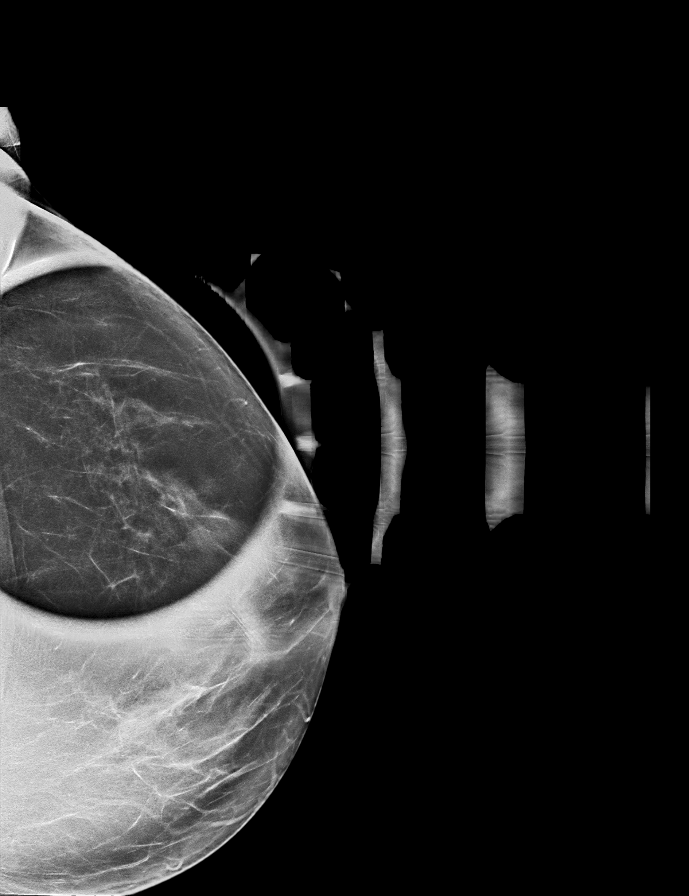

[L MLO synth-2D (1 of 2)]
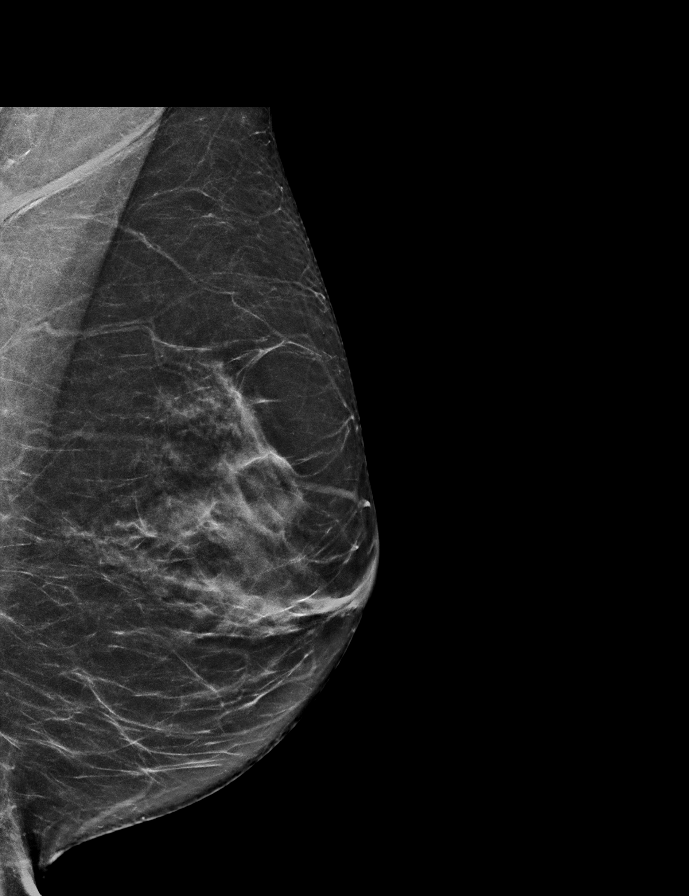

[L MLO]
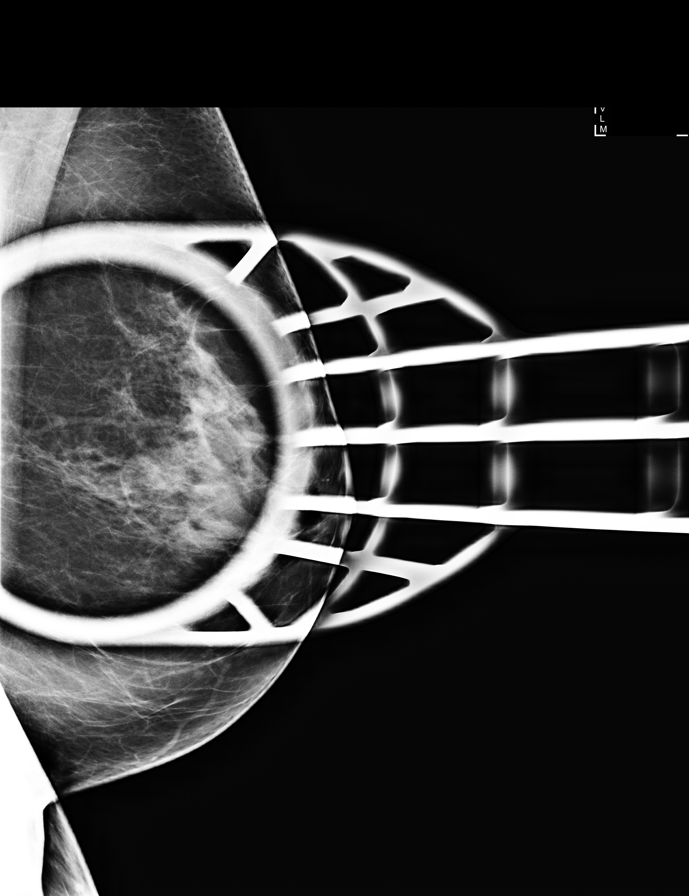

[L MLO synth-2D (2 of 2)]
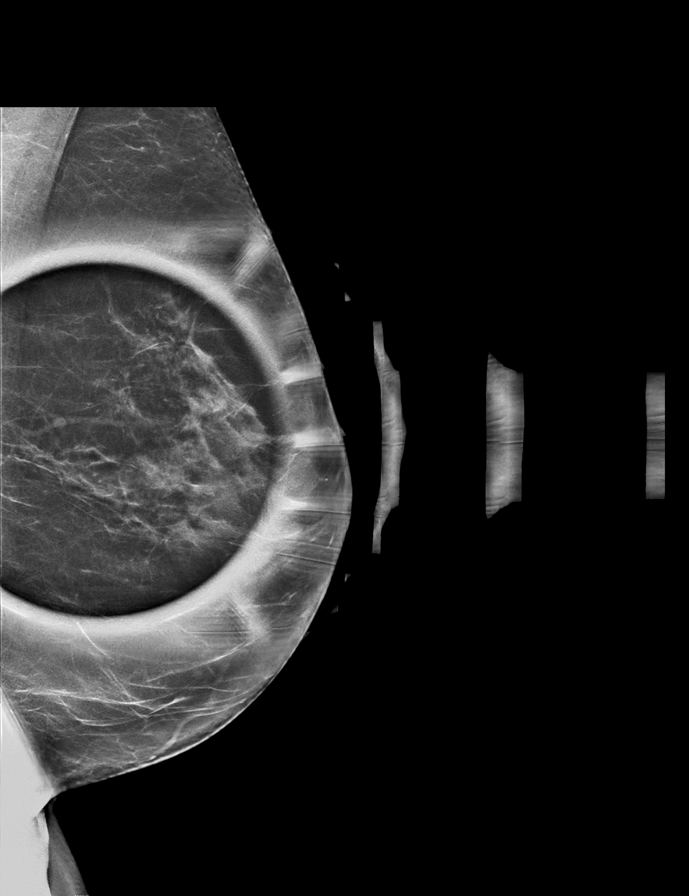

[L CC (2 of 2)]
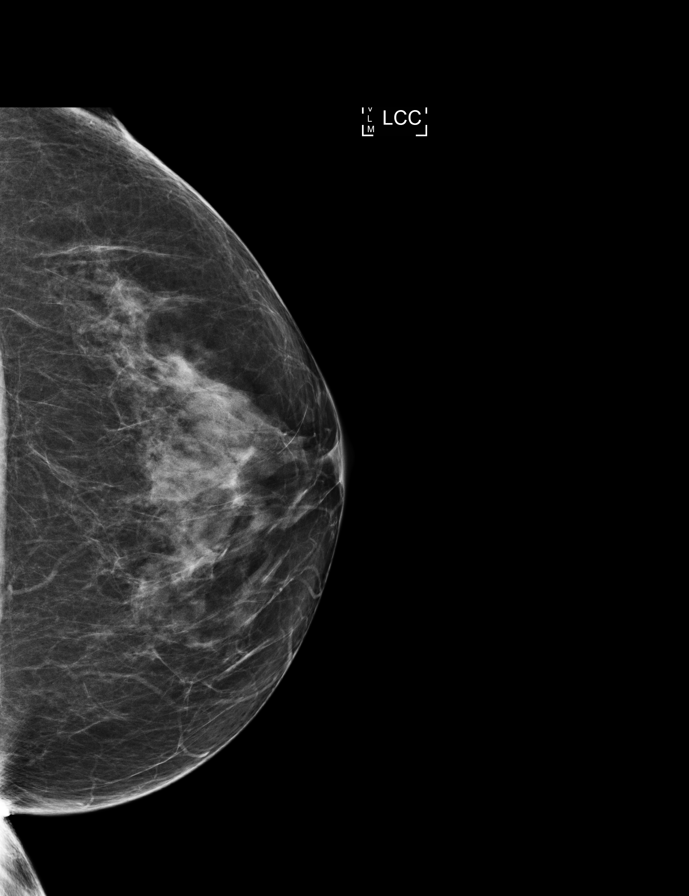

[8 of 40 positions shown; findings below may reference images not displayed]

ACR Breast Density Category b: There are scattered areas of
fibroglandular density.
FINDINGS: CC, MLO and true lateral tomosynthesis images of the left breast
were obtained as well as spot compression CC and MLO views.
Questioned asymmetry predominately effaced within the left breast.
Findings are suggestive of dense fibroglandular tissue.

Mammographic images were processed with CAD.

On physical exam, I palpate no discrete mass within the lateral and
lower left breast.

Targeted ultrasound is performed, showing normal dense tissue
without suspicious mass within the lateral and lower left breast
[DATE] position 3 cm from the nipple.
IMPRESSION: Left breast asymmetry favored to represent a focal area of dense
fibroglandular tissue.

RECOMMENDATION:
Left breast diagnostic mammogram and possible ultrasound in 6 months
to ensure stability of left breast asymmetry.

I have discussed the findings and recommendations with the patient.
Results were also provided in writing at the conclusion of the
visit. If applicable, a reminder letter will be sent to the patient
regarding the next appointment.

BI-RADS CATEGORY  3: Probably benign.

## 2019-04-04 IMAGING — US US BREAST*L* LIMITED INC AXILLA
1 series · 2 of 2 positions shown · non-contrast
Comparison: Previous exam(s).

CLINICAL DATA: Patient recalled from screening for left breast
asymmetry.

EXAM:
2D DIGITAL DIAGNOSTIC LEFT MAMMOGRAM WITH CAD AND ADJUNCT TOMO
ULTRASOUND LEFT BREAST

[Series 1: us breast*left* limited inc axilla · 0.07mm/px · 2 of 2 slices shown]
[im 1/2]
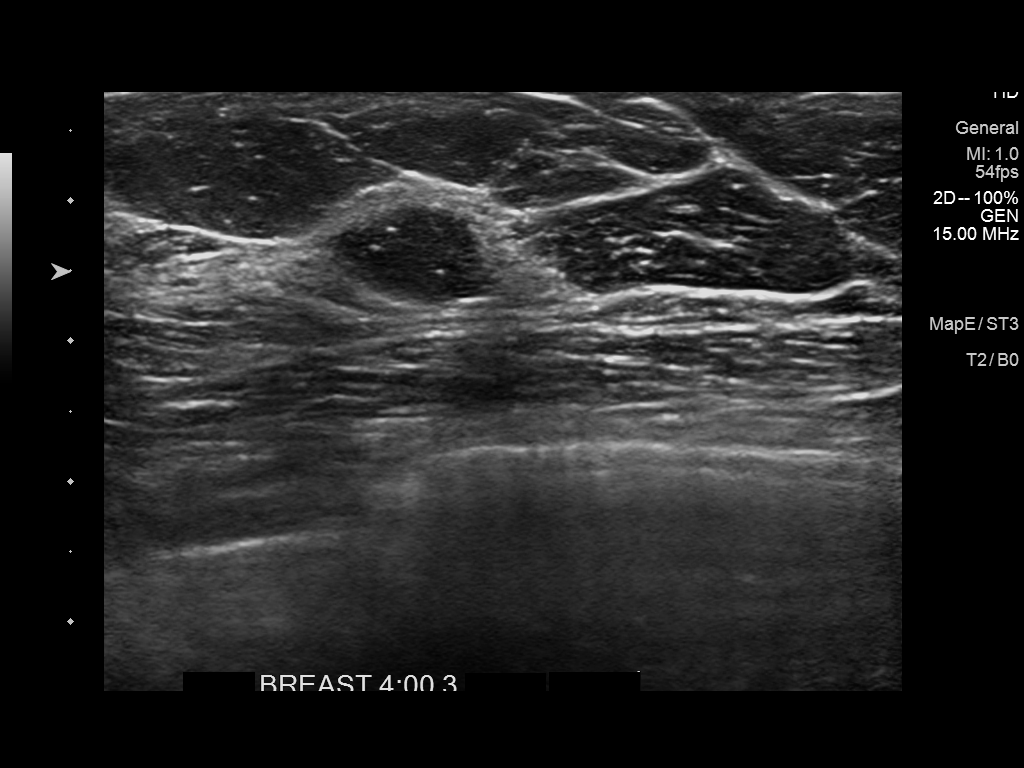
[im 2/2]
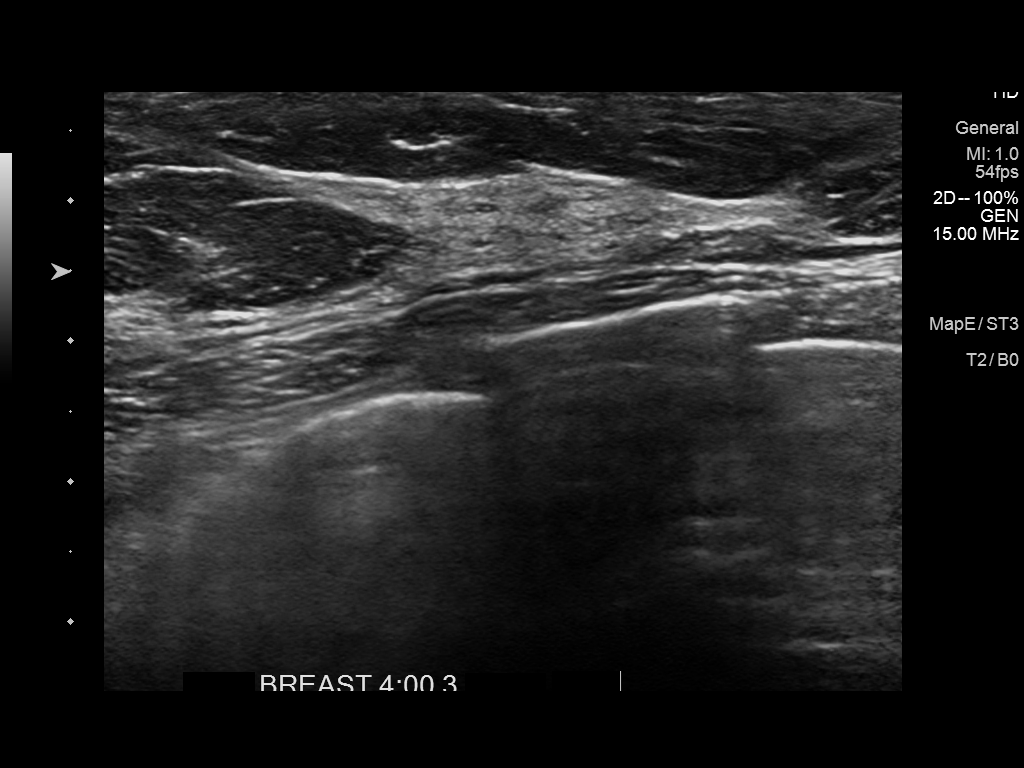

[2 of 2 positions shown; findings below may reference images not displayed]

ACR Breast Density Category b: There are scattered areas of
fibroglandular density.
FINDINGS: CC, MLO and true lateral tomosynthesis images of the left breast
were obtained as well as spot compression CC and MLO views.
Questioned asymmetry predominately effaced within the left breast.
Findings are suggestive of dense fibroglandular tissue.

Mammographic images were processed with CAD.

On physical exam, I palpate no discrete mass within the lateral and
lower left breast.

Targeted ultrasound is performed, showing normal dense tissue
without suspicious mass within the lateral and lower left breast
[DATE] position 3 cm from the nipple.
IMPRESSION: Left breast asymmetry favored to represent a focal area of dense
fibroglandular tissue.

RECOMMENDATION:
Left breast diagnostic mammogram and possible ultrasound in 6 months
to ensure stability of left breast asymmetry.

I have discussed the findings and recommendations with the patient.
Results were also provided in writing at the conclusion of the
visit. If applicable, a reminder letter will be sent to the patient
regarding the next appointment.

BI-RADS CATEGORY  3: Probably benign.

## 2019-04-15 ENCOUNTER — Ambulatory Visit: Payer: BC Managed Care – PPO

## 2019-04-22 ENCOUNTER — Ambulatory Visit: Payer: Self-pay

## 2019-04-30 ENCOUNTER — Ambulatory Visit: Payer: Self-pay | Attending: Internal Medicine

## 2019-04-30 DIAGNOSIS — Z23 Encounter for immunization: Secondary | ICD-10-CM

## 2019-04-30 NOTE — Progress Notes (Signed)
   Covid-19 Vaccination Clinic  Name:  Tracy Crane    MRN: [id number] DOB: [birth year]  04/30/2019  [name] was observed post Covid-19 immunization for 30 minutes based on pre-vaccination screening without incident. She was provided with Vaccine Information Sheet and instruction to access the V-Safe system.   [name] was instructed to call 911 with any severe reactions post vaccine: Marland Kitchen Difficulty breathing  . Swelling of face and throat  . A fast heartbeat  . A bad rash all over body  . Dizziness and weakness   Immunizations Administered    Name Date Dose VIS Date Route   Pfizer COVID-19 Vaccine 04/30/2019  9:49 AM 0.3 mL 02/27/2018 Intramuscular   Manufacturer: [employer], [employer]   Lot: [id number]   NDC: [address]-3

## 2019-05-07 DIAGNOSIS — L700 Acne vulgaris: Secondary | ICD-10-CM | POA: Diagnosis not present

## 2019-05-07 DIAGNOSIS — L988 Other specified disorders of the skin and subcutaneous tissue: Secondary | ICD-10-CM | POA: Diagnosis not present

## 2019-08-15 DIAGNOSIS — B359 Dermatophytosis, unspecified: Secondary | ICD-10-CM | POA: Diagnosis not present

## 2019-08-15 DIAGNOSIS — L649 Androgenic alopecia, unspecified: Secondary | ICD-10-CM | POA: Diagnosis not present

## 2019-08-15 DIAGNOSIS — B351 Tinea unguium: Secondary | ICD-10-CM | POA: Diagnosis not present

## 2019-08-15 DIAGNOSIS — Z79899 Other long term (current) drug therapy: Secondary | ICD-10-CM | POA: Diagnosis not present

## 2019-09-22 DIAGNOSIS — Z20822 Contact with and (suspected) exposure to covid-19: Secondary | ICD-10-CM | POA: Diagnosis not present

## 2020-02-24 ENCOUNTER — Other Ambulatory Visit: Payer: Self-pay | Admitting: Internal Medicine

## 2020-02-24 DIAGNOSIS — R748 Abnormal levels of other serum enzymes: Secondary | ICD-10-CM

## 2020-02-24 DIAGNOSIS — I10 Essential (primary) hypertension: Secondary | ICD-10-CM

## 2020-02-24 DIAGNOSIS — R413 Other amnesia: Secondary | ICD-10-CM

## 2020-03-12 ENCOUNTER — Ambulatory Visit
Admission: RE | Admit: 2020-03-12 | Discharge: 2020-03-12 | Disposition: A | Discharge status: Home / Self Care | Payer: BC Managed Care – PPO | Source: Ambulatory Visit | Attending: Internal Medicine | Admitting: Internal Medicine

## 2020-03-12 ENCOUNTER — Ambulatory Visit: Payer: BC Managed Care – PPO

## 2020-03-12 ENCOUNTER — Other Ambulatory Visit: Payer: Self-pay

## 2020-03-12 DIAGNOSIS — R748 Abnormal levels of other serum enzymes: Secondary | ICD-10-CM | POA: Diagnosis not present

## 2020-06-10 ENCOUNTER — Telehealth: Payer: Self-pay | Admitting: *Deleted

## 2020-06-10 ENCOUNTER — Encounter: Payer: Self-pay | Admitting: *Deleted

## 2020-06-10 DIAGNOSIS — Z122 Encounter for screening for malignant neoplasm of respiratory organs: Secondary | ICD-10-CM

## 2020-06-10 DIAGNOSIS — Z87891 Personal history of nicotine dependence: Secondary | ICD-10-CM

## 2020-06-10 NOTE — Telephone Encounter (Signed)
Received referral for initial lung cancer screening scan. Contacted patient and obtained smoking history,(former, quit 2020, 42 pack year) as well as answering questions related to screening process. Patient denies signs of lung cancer such as weight loss or hemoptysis. Patient denies comorbidity that would prevent curative treatment if lung cancer were found. Patient is scheduled for shared decision making visit and CT scan on 06/23/20 at 130pm.

## 2020-06-23 ENCOUNTER — Ambulatory Visit: Payer: Self-pay

## 2020-06-23 ENCOUNTER — Inpatient Hospital Stay: Payer: 59 | Attending: Hospice and Palliative Medicine | Admitting: Hospice and Palliative Medicine

## 2020-06-23 DIAGNOSIS — Z122 Encounter for screening for malignant neoplasm of respiratory organs: Secondary | ICD-10-CM

## 2020-06-23 DIAGNOSIS — Z87891 Personal history of nicotine dependence: Secondary | ICD-10-CM | POA: Diagnosis not present

## 2020-06-23 NOTE — Progress Notes (Signed)
Virtual Visit via Telephone Note  I connected withNAME@ on 06/23/20 [email or web address]@ by telephone and verified that I am speaking with the correct person using two identifiers.   I discussed the limitations of evaluation and management by telemedicine and the availability of in person appointments. The patient expressed understanding and agreed to proceed.  Location: Patient: Home Provider: Clinic   In accordance with CMS guidelines, patient has met eligibility criteria including age, absence of signs or symptoms of lung cancer.  Social History   Tobacco Use   Smoking status: Former    Packs/day: 1.00    Years: 42.00    Pack years: 42.00    Types: Cigarettes    Quit date: 2020    Years since quitting: 2.4   Smokeless tobacco: Former  Scientific laboratory technician Use: Former  Substance Use Topics   Alcohol use: Not Currently   Drug use: Not Currently    Types: Cocaine      A shared decision-making session was conducted prior to the performance of CT scan. This includes one or more decision aids, includes benefits and harms of screening, follow-up diagnostic testing, over-diagnosis, false positive rate, and total radiation exposure.   Counseling on the importance of adherence to annual lung cancer LDCT screening, impact of co-morbidities, and ability or willingness to undergo diagnosis and treatment is imperative for compliance of the program.   Counseling on the importance of continued smoking cessation for former smokers; the importance of smoking cessation for current smokers, and information about tobacco cessation interventions have been given to patient including [place] and [facility] quit  programs.   Written order for lung cancer screening with LDCT has been given to the patient and any and all questions have been answered to the best of my abilities.    Yearly follow up will be coordinated by Burgess Estelle, Thoracic Navigator.  Time Total: 5 minutes  Visit consisted  of counseling and education dealing with complex health screening. Greater than 50%  of this time was spent counseling and coordinating care related to the above assessment and plan.  Signed by: Altha Harm, PhD, NP-C

## 2020-06-29 ENCOUNTER — Telehealth: Payer: Self-pay | Admitting: *Deleted

## 2020-06-29 NOTE — Telephone Encounter (Signed)
error 

## 2020-07-13 ENCOUNTER — Other Ambulatory Visit: Payer: Self-pay

## 2020-07-13 ENCOUNTER — Ambulatory Visit
Admission: RE | Admit: 2020-07-13 | Discharge: 2020-07-13 | Disposition: A | Discharge status: Home / Self Care | Payer: 59 | Source: Ambulatory Visit | Attending: Hospice and Palliative Medicine | Admitting: Hospice and Palliative Medicine

## 2020-07-13 DIAGNOSIS — Z122 Encounter for screening for malignant neoplasm of respiratory organs: Secondary | ICD-10-CM | POA: Diagnosis present

## 2020-07-13 DIAGNOSIS — Z87891 Personal history of nicotine dependence: Secondary | ICD-10-CM

## 2020-07-15 ENCOUNTER — Encounter: Payer: Self-pay | Admitting: *Deleted

## 2021-04-13 ENCOUNTER — Other Ambulatory Visit: Payer: Self-pay | Admitting: Internal Medicine

## 2021-04-14 ENCOUNTER — Other Ambulatory Visit: Payer: Self-pay | Admitting: Internal Medicine

## 2021-04-14 DIAGNOSIS — Z1231 Encounter for screening mammogram for malignant neoplasm of breast: Secondary | ICD-10-CM

## 2021-04-29 ENCOUNTER — Other Ambulatory Visit: Payer: Self-pay | Admitting: Internal Medicine

## 2021-04-29 DIAGNOSIS — N644 Mastodynia: Secondary | ICD-10-CM

## 2021-05-03 ENCOUNTER — Other Ambulatory Visit: Payer: Self-pay | Admitting: Internal Medicine

## 2021-05-03 DIAGNOSIS — Z1231 Encounter for screening mammogram for malignant neoplasm of breast: Secondary | ICD-10-CM

## 2021-06-03 ENCOUNTER — Ambulatory Visit
Admission: RE | Admit: 2021-06-03 | Discharge: 2021-06-03 | Disposition: A | Discharge status: Home / Self Care | Payer: 59 | Source: Ambulatory Visit | Attending: Internal Medicine | Admitting: Internal Medicine

## 2021-06-03 DIAGNOSIS — Z1231 Encounter for screening mammogram for malignant neoplasm of breast: Secondary | ICD-10-CM | POA: Insufficient documentation

## 2021-09-16 ENCOUNTER — Telehealth: Payer: Self-pay | Admitting: Acute Care

## 2021-09-16 NOTE — Telephone Encounter (Signed)
Attempted to reach pt to schedule annual LDCT-LVMM 

## 2022-06-10 ENCOUNTER — Other Ambulatory Visit: Payer: Self-pay

## 2022-06-10 DIAGNOSIS — Z1231 Encounter for screening mammogram for malignant neoplasm of breast: Secondary | ICD-10-CM

## 2022-10-26 ENCOUNTER — Ambulatory Visit
Admission: RE | Admit: 2022-10-26 | Discharge: 2022-10-26 | Disposition: A | Discharge status: Home / Self Care | Payer: 59 | Source: Ambulatory Visit | Attending: Internal Medicine | Admitting: Internal Medicine

## 2022-10-26 DIAGNOSIS — Z1231 Encounter for screening mammogram for malignant neoplasm of breast: Secondary | ICD-10-CM | POA: Insufficient documentation

## 2022-12-14 ENCOUNTER — Other Ambulatory Visit: Payer: Self-pay | Admitting: Internal Medicine

## 2022-12-14 DIAGNOSIS — K219 Gastro-esophageal reflux disease without esophagitis: Secondary | ICD-10-CM

## 2022-12-14 DIAGNOSIS — F17210 Nicotine dependence, cigarettes, uncomplicated: Secondary | ICD-10-CM

## 2023-01-11 ENCOUNTER — Ambulatory Visit
Admission: RE | Admit: 2023-01-11 | Discharge: 2023-01-11 | Disposition: A | Discharge status: Home / Self Care | Payer: 59 | Source: Ambulatory Visit | Attending: Internal Medicine | Admitting: Internal Medicine

## 2023-01-11 DIAGNOSIS — K219 Gastro-esophageal reflux disease without esophagitis: Secondary | ICD-10-CM | POA: Insufficient documentation

## 2023-01-11 DIAGNOSIS — F17210 Nicotine dependence, cigarettes, uncomplicated: Secondary | ICD-10-CM | POA: Diagnosis present

## 2023-02-24 ENCOUNTER — Other Ambulatory Visit: Payer: Self-pay | Admitting: Internal Medicine

## 2023-02-24 DIAGNOSIS — N644 Mastodynia: Secondary | ICD-10-CM

## 2023-02-27 ENCOUNTER — Other Ambulatory Visit: Payer: Self-pay | Admitting: Internal Medicine

## 2023-02-27 DIAGNOSIS — N644 Mastodynia: Secondary | ICD-10-CM

## 2023-02-28 ENCOUNTER — Other Ambulatory Visit: Payer: Self-pay | Admitting: Internal Medicine

## 2023-02-28 DIAGNOSIS — N644 Mastodynia: Secondary | ICD-10-CM

## 2023-03-07 ENCOUNTER — Ambulatory Visit
Admission: RE | Admit: 2023-03-07 | Discharge: 2023-03-07 | Disposition: A | Discharge status: Home / Self Care | Payer: 59 | Source: Ambulatory Visit | Attending: Internal Medicine | Admitting: Internal Medicine

## 2023-03-07 ENCOUNTER — Ambulatory Visit
Admission: RE | Admit: 2023-03-07 | Discharge: 2023-03-07 | Disposition: A | Discharge status: Home / Self Care | Source: Ambulatory Visit | Attending: Internal Medicine | Admitting: Internal Medicine

## 2023-03-07 ENCOUNTER — Other Ambulatory Visit: Payer: Self-pay | Admitting: Internal Medicine

## 2023-03-07 DIAGNOSIS — N644 Mastodynia: Secondary | ICD-10-CM | POA: Insufficient documentation

## 2023-03-28 ENCOUNTER — Other Ambulatory Visit: Payer: Self-pay | Admitting: Internal Medicine

## 2023-03-28 DIAGNOSIS — R413 Other amnesia: Secondary | ICD-10-CM

## 2023-04-06 ENCOUNTER — Ambulatory Visit
Admission: RE | Admit: 2023-04-06 | Discharge: 2023-04-06 | Disposition: A | Discharge status: Home / Self Care | Source: Ambulatory Visit | Attending: Internal Medicine | Admitting: Internal Medicine

## 2023-04-06 DIAGNOSIS — R413 Other amnesia: Secondary | ICD-10-CM | POA: Insufficient documentation

## 2024-01-11 ENCOUNTER — Ambulatory Visit

## 2024-03-07 ENCOUNTER — Ambulatory Visit
